# Patient Record
Sex: Female | Born: 1965 | Race: White | Hispanic: No | Marital: Married | Smoking: Never smoker
Health system: Southern US, Community
[De-identification: ages and names within clinical notes are randomized; demographics above are authoritative.]

---

## 1999-10-12 ENCOUNTER — Inpatient Hospital Stay (HOSPITAL_COMMUNITY): Admission: AD | Admit: 1999-10-12 | Discharge: 1999-10-12 | Payer: Self-pay | Admitting: Obstetrics

## 2004-01-21 ENCOUNTER — Other Ambulatory Visit: Admission: RE | Admit: 2004-01-21 | Discharge: 2004-01-21 | Payer: Self-pay | Admitting: Obstetrics and Gynecology

## 2005-03-21 ENCOUNTER — Ambulatory Visit (HOSPITAL_COMMUNITY): Admission: AD | Admit: 2005-03-21 | Discharge: 2005-03-21 | Payer: Self-pay | Admitting: Obstetrics and Gynecology

## 2005-03-27 ENCOUNTER — Ambulatory Visit (HOSPITAL_COMMUNITY): Admission: RE | Admit: 2005-03-27 | Discharge: 2005-03-27 | Payer: Self-pay | Admitting: Obstetrics and Gynecology

## 2005-03-27 ENCOUNTER — Encounter (INDEPENDENT_AMBULATORY_CARE_PROVIDER_SITE_OTHER): Payer: Self-pay | Admitting: Specialist

## 2006-08-15 ENCOUNTER — Encounter: Admission: RE | Admit: 2006-08-15 | Discharge: 2006-08-15 | Payer: Self-pay | Admitting: Obstetrics and Gynecology

## 2007-08-26 ENCOUNTER — Encounter: Admission: RE | Admit: 2007-08-26 | Discharge: 2007-08-26 | Payer: Self-pay | Admitting: Obstetrics and Gynecology

## 2008-08-26 ENCOUNTER — Encounter: Admission: RE | Admit: 2008-08-26 | Discharge: 2008-08-26 | Payer: Self-pay | Admitting: Obstetrics and Gynecology

## 2008-08-26 IMAGING — MG MM SCREEN MAMMOGRAM BILATERAL
4 series · 4 of 4 positions shown · non-contrast
Comparison: Prior studies.

DG SCREEN MAMMOGRAM BILATERAL
Bilateral CC and MLO view(s) were taken.
Prior study comparison: [DATE], DG screen mammogram bilateral.

DIGITAL SCREENING MAMMOGRAM WITH CAD:

[R CC]
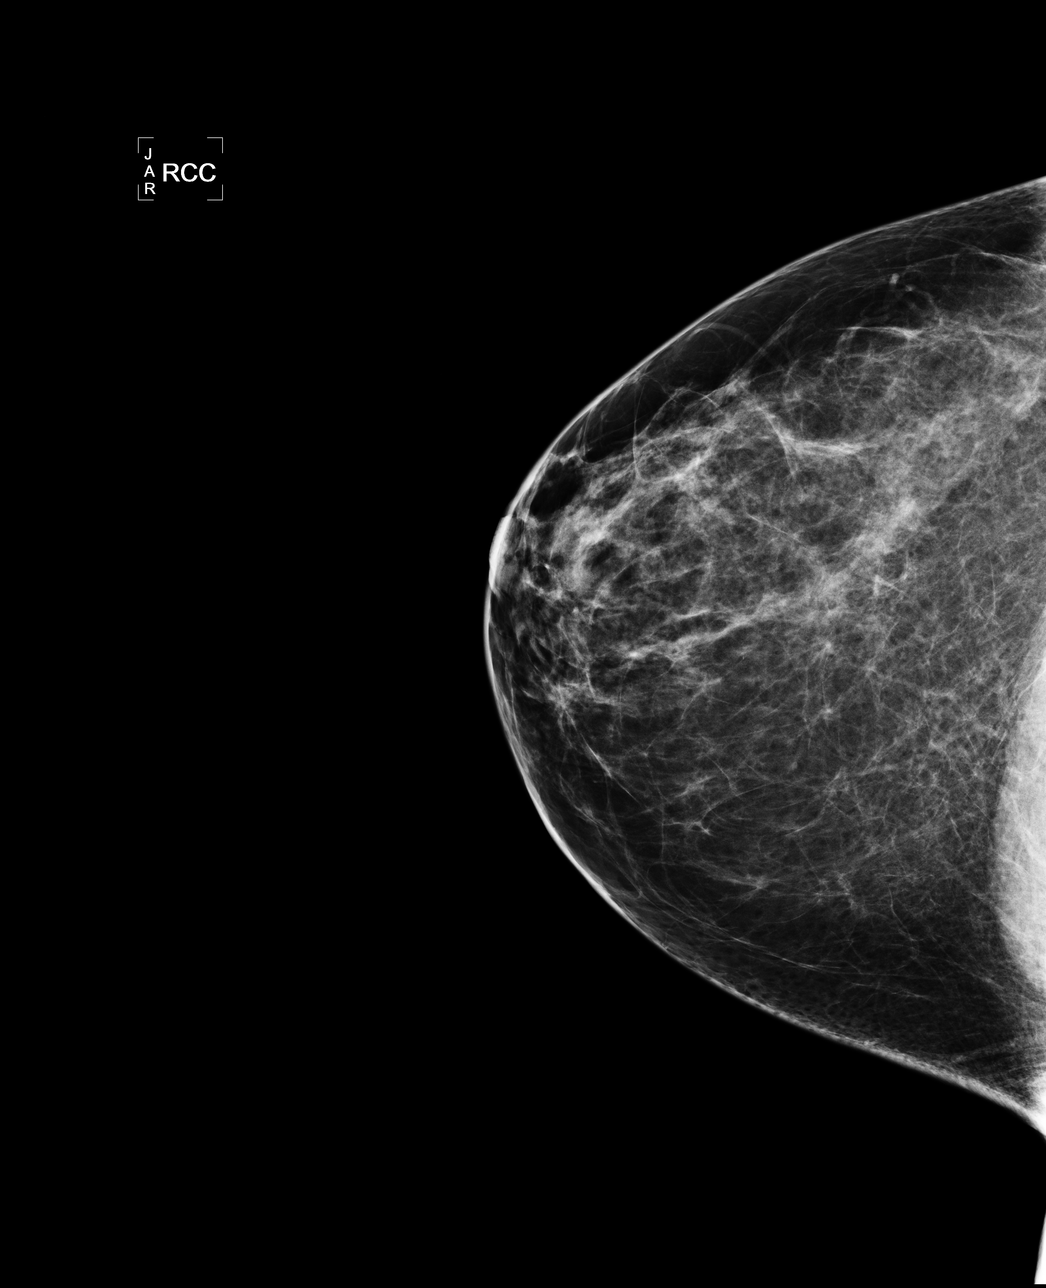

[L CC]
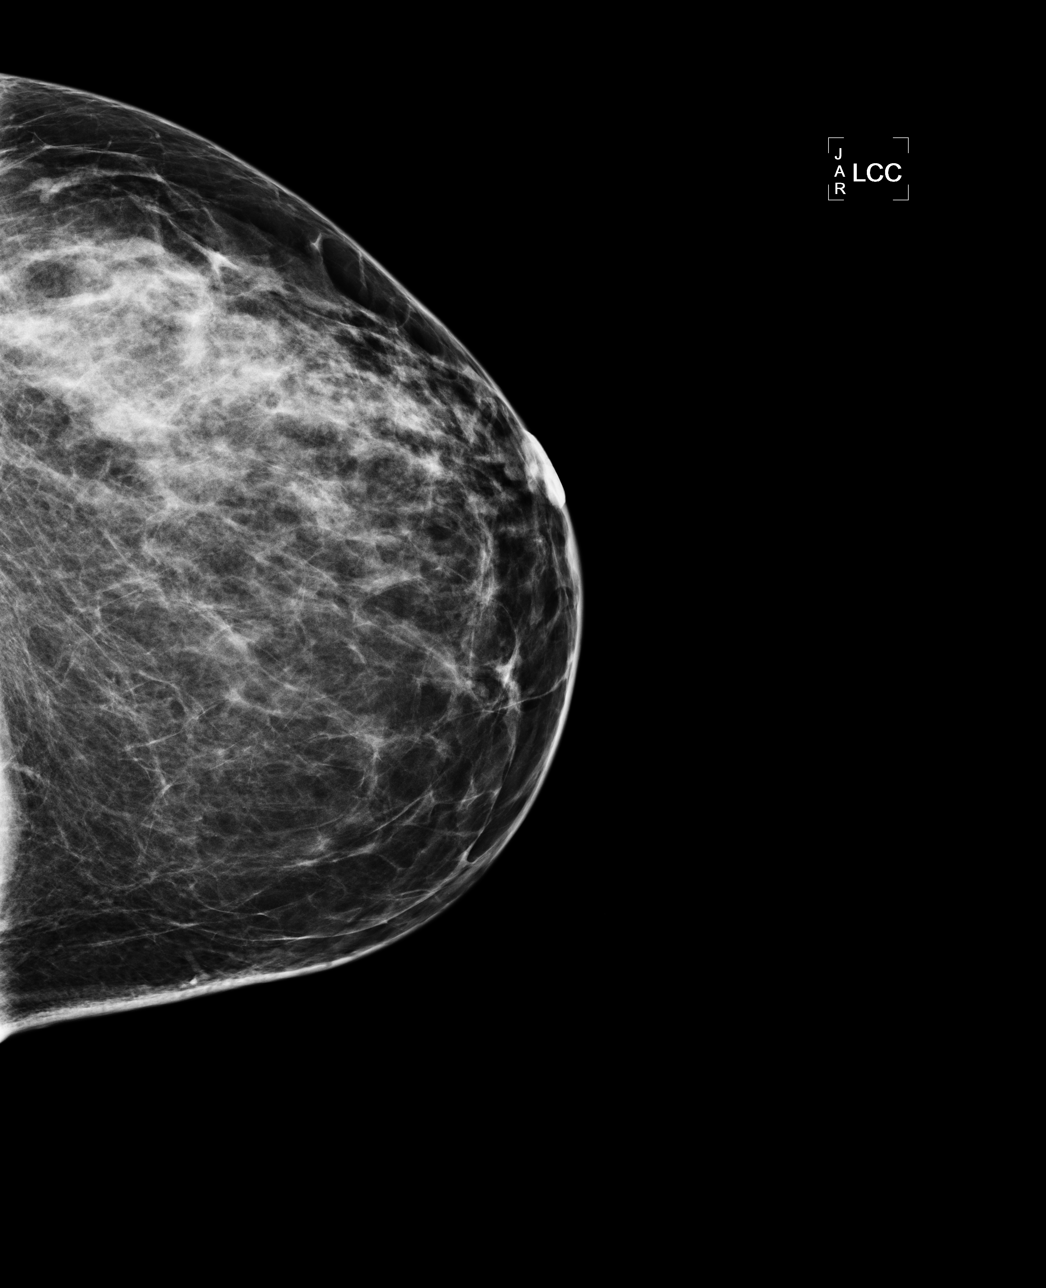

[L MLO]
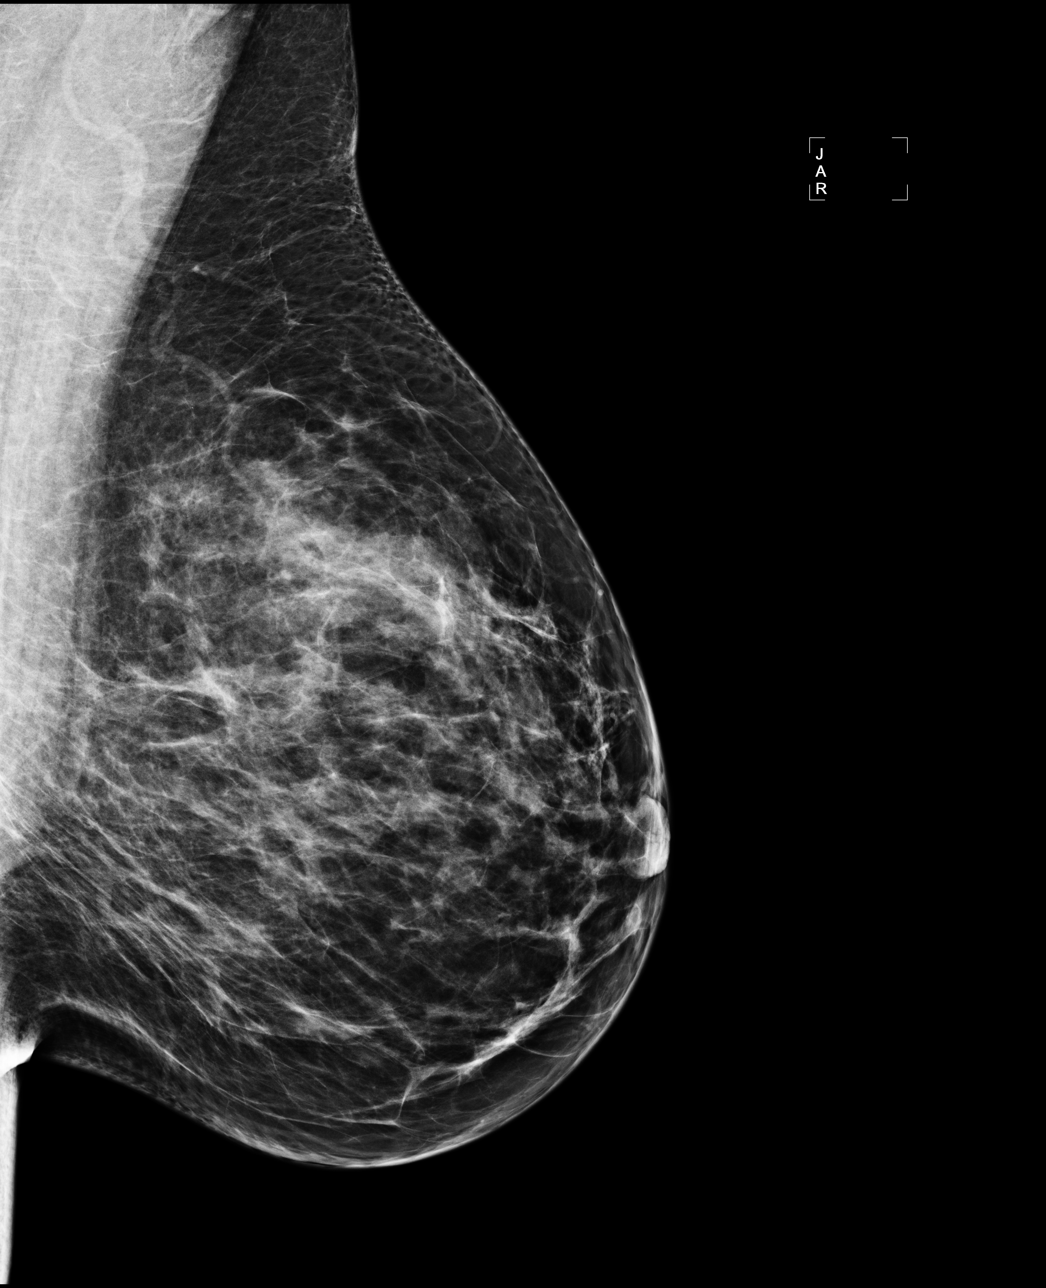

[R MLO]
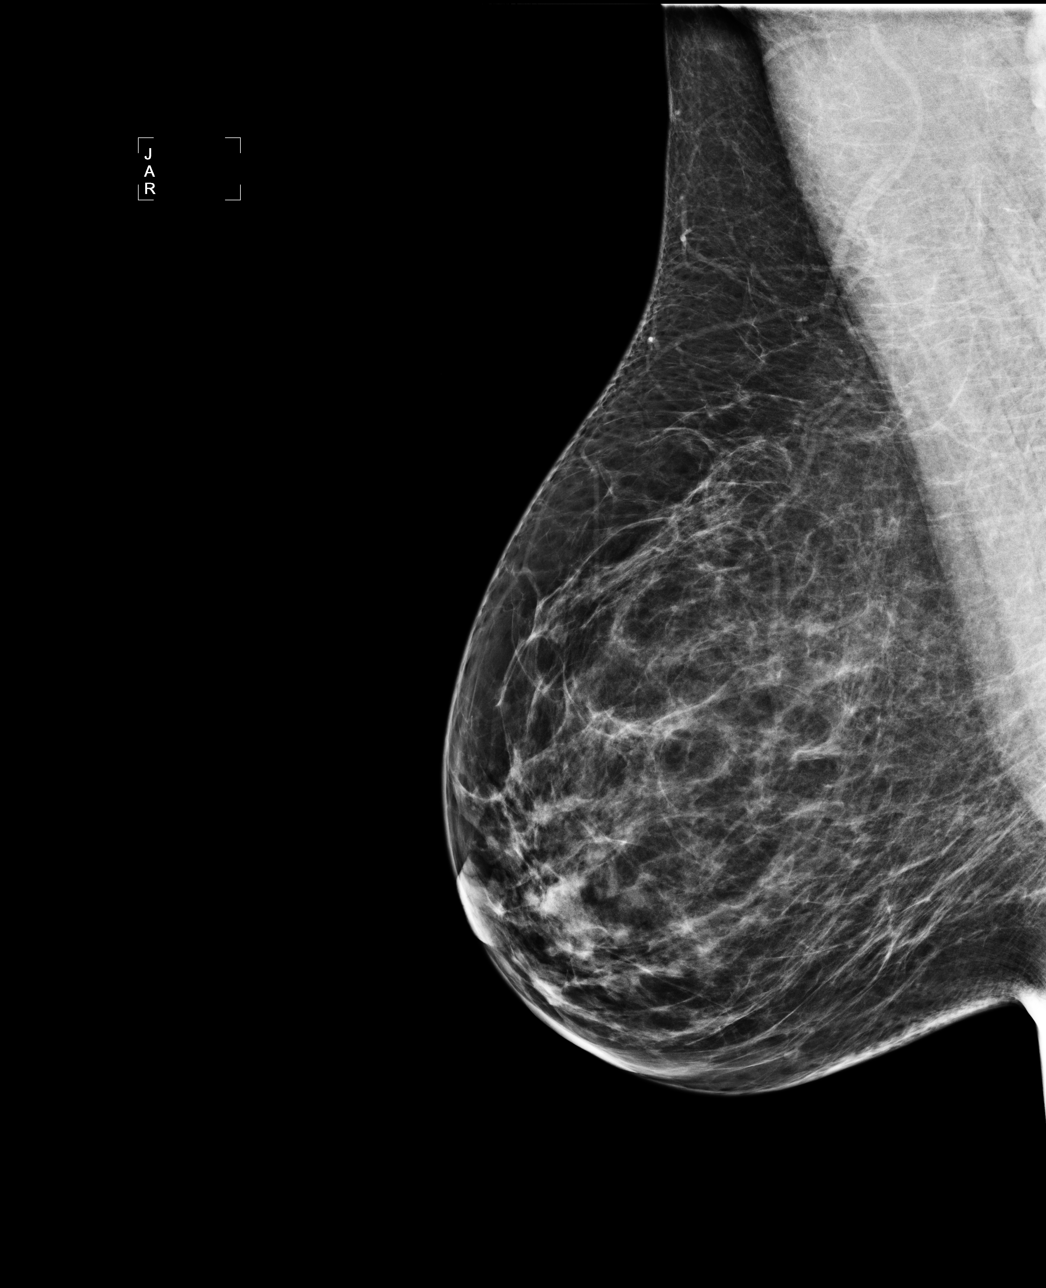

[4 of 4 positions shown; findings below may reference images not displayed]

There are scattered fibroglandular densities.  There is no dominant mass, architectural distortion 
or calcification to suggest malignancy.

Images were processed with CAD.
IMPRESSION: No mammographic evidence of malignancy.  Suggest yearly screening mammography.

A result letter of this screening mammogram will be mailed directly to the patient.

ASSESSMENT: Negative - BI-RADS 1

Screening mammogram in 1 year.
,

## 2009-08-27 ENCOUNTER — Encounter: Admission: RE | Admit: 2009-08-27 | Discharge: 2009-08-27 | Payer: Self-pay | Admitting: Obstetrics and Gynecology

## 2010-01-30 ENCOUNTER — Encounter: Payer: Self-pay | Admitting: Obstetrics and Gynecology

## 2010-05-27 NOTE — H&P (Signed)
NAMEJUDYTH, Sharon Walter           ACCOUNT NO.:  [id number]   MEDICAL RECORD NO.:  [id number]          PATIENT TYPE:  AMB   LOCATION:  [facility]                           FACILITY:  [facility]   PHYSICIAN:  Huel Cote, M.D. DATE OF BIRTH:  [birth year]   DATE OF ADMISSION:  03/27/2004  DATE OF DISCHARGE:                                HISTORY & PHYSICAL   HISTORY:  The patient is a 45 year old G1, P0, who comes in for a scheduled  suction D&C for a missed AB which is approximately 7.5 weeks by crown-rump  length with no fetal heart motion noted on ultrasound. After being counseled  as to options of expectant management versus D&C, the patient elects a D&C.   PAST MEDICAL HISTORY:  Significant for cholelithiasis.   PAST SURGICAL HISTORY:  None.   ALLERGIES:  None.   PAST GYN HISTORY:  No abnormal Pap smears, does have a history of HSV.   PHYSICAL EXAMINATION:  The patient is 5 feet, 8 inches. Weight is 230  pounds.  CARDIAC EXAM:  Regular rate and rhythm.  LUNGS:  Clear.  ABDOMEN:  Soft, nontender.  PELVIC EXAM:  She has normal external genitalia. Cervix normal. Uterus is  approximately 7-8 weeks in size and the adnexa are slightly large but still  within the normal range.   The patient became unexpectedly pregnant at 34-years of age although  pleasantly surprised. She had some history of secondary infertility which  may or may not be related to possible endometriosis. On ultrasound the  patient is noted to have some bilateral ovarian cysts which are slightly  complex nature which will be followed up with ultrasound after surgery. The  patient was carefully counseled of the risks and benefits of the procedure  including bleeding, infection, possible uterine perforation. The patient  understands these risks and desires to proceed with surgery as stated. She  declines genetic testing on the products of conception. However knows these  will be sent to pathology. The patient will also use  Cytotec 400 mcg in the  vagina 4 hours prior to procedure to assist with cervical dilation.      Huel Cote, M.D.  Electronically Signed     KR/MEDQ  D:  03/23/2005  T:  03/23/2005  Job:  [id number]

## 2010-05-27 NOTE — Op Note (Signed)
NAMEIMO, CUMBIE           ACCOUNT NO.:  [id number]   MEDICAL RECORD NO.:  [id number]          PATIENT TYPE:  AMB   LOCATION:  [facility]                           FACILITY:  [facility]   PHYSICIAN:  Huel Cote, M.D. DATE OF BIRTH:  Jul 06, 1965   DATE OF PROCEDURE:  03/27/2005  DATE OF DISCHARGE:                                 OPERATIVE REPORT   PREOPERATIVE DIAGNOSIS:  Missed abortion at 7+ weeks.   POSTOPERATIVE DIAGNOSIS:  Missed abortion at 7+ weeks.   PROCEDURE:  Suction dilatation and evacuation.   SURGEON:  Huel Cote, M.D.   ASSISTANT:  None.   ANESTHESIA:  MAC with a local 1% lidocaine block.   SPECIMENS:  Products of conception were sent.   ESTIMATED BLOOD LOSS:  100 mL.   COMPLICATIONS:  None.   The uterus was approximately 7 to 8 weeks in size with moderate amount of  products of conception noted consistent with this gestational age.   PROCEDURE:  The patient was taken to the operating room, where MAC  anesthesia was obtained without difficulty.  She was then prepped and draped  in the normal sterile fashion in the dorsal lithotomy position.  A speculum  was then placed in the patient's vagina and an in-and-out catheter utilized  to empty the bladder.  The lidocaine was then used approximately 20 mL with  10 mL injected at 2 o'clock and 10 mL injected at 10 o'clock for a local  block.  The uterus was then sounded without difficulty and sounded to  approximately 8 cm.  A 7 mm suction curette was then easily introduced after  the cervix was serially dilated with the [facility] dilators.  This was  accomplished easily as the patient had used Cytotec earlier in the morning  and the os was already partially dilated.  The suction curette was then  introduced into the uterine fundus and suction applied with a moderate  amount of products of conception obtained without difficulty.  Two  additional passes obtained only minimal tissue; therefore, suction was  discontinued and a sharp curettage performed with again no substantial  tissue note.  One additional pass with the suction curettage retrieved only  minimal amounts of decidualized-appearing tissue.  Therefore, the procedure  was concluded.  The tenaculum was removed from the patient's cervix and one  small area of bleeding  treated with a ring forceps.  There was no active bleeding noted after this  point aside from a very small amount from the cervical os.  At this point  all instruments and sponges were removed from the patient's vagina and the  patient was awakened from her MAC anesthesia and taken to the recovery room  without difficulty.      Huel Cote, M.D.  Electronically Signed     KR/MEDQ  D:  03/27/2005  T:  03/28/2005  Job:  [id number]

## 2010-07-28 ENCOUNTER — Other Ambulatory Visit: Payer: Self-pay | Admitting: Obstetrics and Gynecology

## 2010-07-28 DIAGNOSIS — Z1231 Encounter for screening mammogram for malignant neoplasm of breast: Secondary | ICD-10-CM

## 2010-08-29 ENCOUNTER — Ambulatory Visit
Admission: RE | Admit: 2010-08-29 | Discharge: 2010-08-29 | Disposition: A | Discharge status: Home / Self Care | Payer: BC Managed Care – PPO | Source: Ambulatory Visit | Attending: Obstetrics and Gynecology | Admitting: Obstetrics and Gynecology

## 2010-08-29 DIAGNOSIS — Z1231 Encounter for screening mammogram for malignant neoplasm of breast: Secondary | ICD-10-CM

## 2011-08-01 ENCOUNTER — Other Ambulatory Visit: Payer: Self-pay | Admitting: Obstetrics and Gynecology

## 2011-08-01 DIAGNOSIS — Z1231 Encounter for screening mammogram for malignant neoplasm of breast: Secondary | ICD-10-CM

## 2011-08-30 ENCOUNTER — Ambulatory Visit
Admission: RE | Admit: 2011-08-30 | Discharge: 2011-08-30 | Disposition: A | Discharge status: Home / Self Care | Payer: BC Managed Care – PPO | Source: Ambulatory Visit | Attending: Obstetrics and Gynecology | Admitting: Obstetrics and Gynecology

## 2011-08-30 DIAGNOSIS — Z1231 Encounter for screening mammogram for malignant neoplasm of breast: Secondary | ICD-10-CM

## 2015-07-11 ENCOUNTER — Emergency Department (HOSPITAL_BASED_OUTPATIENT_CLINIC_OR_DEPARTMENT_OTHER)
Admission: EM | Admit: 2015-07-11 | Discharge: 2015-07-11 | Disposition: A | Discharge status: Home / Self Care | Payer: BC Managed Care – PPO | Attending: Emergency Medicine | Admitting: Emergency Medicine

## 2015-07-11 ENCOUNTER — Encounter (HOSPITAL_BASED_OUTPATIENT_CLINIC_OR_DEPARTMENT_OTHER): Payer: Self-pay | Admitting: Emergency Medicine

## 2015-07-11 DIAGNOSIS — M7989 Other specified soft tissue disorders: Secondary | ICD-10-CM | POA: Diagnosis not present

## 2015-07-11 DIAGNOSIS — M79641 Pain in right hand: Secondary | ICD-10-CM | POA: Diagnosis present

## 2015-07-11 NOTE — ED Notes (Signed)
Cut ring off with the gem ring cutter per pt's request due to finger swelling.

## 2015-07-11 NOTE — ED Provider Notes (Addendum)
CSN: [id number]     Arrival date & time 07/11/15  0941 History   First MD Initiated Contact with Patient 07/11/15 [phone]     Chief Complaint  Patient presents with  . Hand Pain     (Consider location/radiation/quality/duration/timing/severity/associated sxs/prior Treatment) The history is provided by the patient.  Sharon Walter is a 50 y.o. female here presenting with right hand swelling. Patient states that she has been outside in the heat yesterday and a party. She has been drinking alcohol and eating some hot dogs. Woke up this morning and noticed that her right hand has been diffusely swollen. Denies trauma or injury to the hand. Denies history of arthritis. Went to urgent care, who is unable to remove the ring manually and sent here for evaluation   History reviewed. No pertinent past medical history. History reviewed. No pertinent past surgical history. No family history on file. Social History  Substance Use Topics  . Smoking status: Never Smoker   . Smokeless tobacco: None  . Alcohol Use: Yes     Comment: occassional   OB History    No data available     Review of Systems  Musculoskeletal:       Hand swelling   All other systems reviewed and are negative.     Allergies  Review of patient's allergies indicates no known allergies.  Home Medications   Prior to Admission medications   Not on File   BP 145/97 mmHg  Pulse 72  Temp(Src) 97.6 F (36.4 C) (Oral)  Resp 18  Ht 5\' 8"  (1.727 m)  Wt 240 lb (108.863 kg)  BMI 36.50 kg/m2  SpO2 100%  LMP 05/17/2015 (Exact Date) Physical Exam  Constitutional: She appears well-developed and well-nourished.  HENT:  Head: Normocephalic.  Eyes: Pupils are equal, round, and reactive to light.  Neck: Normal range of motion.  Cardiovascular: Normal rate.   Pulmonary/Chest: Effort normal.  Abdominal: Soft.  Musculoskeletal:  R hand diffusely swollen, no bony tenderness. Good radial pulses, nl capillary refill. Ring  removed by staff, able to bend all digits, neurovascular intact   Neurological: She is alert.  Skin: Skin is warm.  Psychiatric: She has a normal mood and affect. Her behavior is normal. Judgment and thought content normal.  Vitals reviewed.   ED Course  Procedures (including critical care time) Labs Review Labs Reviewed - No data to display  Imaging Review No results found. I have personally reviewed and evaluated these images and lab results as part of my medical decision-making.   EKG Interpretation None      MDM   Final diagnoses:  None    Sharon FrameMichelle Misch is a 50 y.o. female here with hand swelling, no trauma. Likely related to being in the heat yesterday and eating hot dogs. No swelling in the leg or other extremities. No signs of DVT/PE. Ring was removed with an electric ring cutter by staff. Vitals stable, well appearing. No hx of kidney issues. Recommend ice, elevation, low salt diet     Richardean Canalavid H Yao, MD 07/11/15 1008  Richardean Canalavid H Yao, MD 07/11/15 [phone]

## 2015-07-11 NOTE — Discharge Instructions (Signed)
Keep right arm elevated.   Eat low salt diet.   Apply ice to swelling.   See your doctor  Return to ER if you have worse hand swelling and severe pain, redness to the hand, unable to feel your hand

## 2015-07-11 NOTE — ED Notes (Signed)
Pt states right 4th finger began swelling last night due to unknown cause and today could not remove ring.  Pt denies any known injury to finger.

## 2020-01-29 ENCOUNTER — Ambulatory Visit (INDEPENDENT_AMBULATORY_CARE_PROVIDER_SITE_OTHER): Payer: BC Managed Care – PPO | Admitting: Allergy & Immunology

## 2020-01-29 ENCOUNTER — Encounter: Payer: Self-pay | Admitting: Allergy & Immunology

## 2020-01-29 ENCOUNTER — Other Ambulatory Visit: Payer: Self-pay

## 2020-01-29 VITALS — BP 130/90 | HR 78 | Temp 97.7°F | Resp 18 | Ht 68.0 in | Wt 246.2 lb

## 2020-01-29 DIAGNOSIS — R1310 Dysphagia, unspecified: Secondary | ICD-10-CM | POA: Diagnosis not present

## 2020-01-29 DIAGNOSIS — J310 Chronic rhinitis: Secondary | ICD-10-CM

## 2020-01-29 NOTE — Patient Instructions (Addendum)
1. Chronic rhinitis - Testing today showed: negative to the entire panel. - Copy of test results provided.  - This may be what is known as "local allergic rhinitis", which occurs when your immune system makes localized IgE (allergy antibodies) to various allergens (as opposed to systemic) IgE - Therefore, since IgE is not make systemically, allergic sensitizations are not picked up on routine skin testing. - There are specialized academic research centers that can test for IgE in the nasal cavity, but this is not done in the clinical setting at this time.  - It can still be treated with nasal sprays and antihistamines.  - Stop taking: Flonase and Singulair (montelukast) - Continue with: Zyrtec (cetirizine) - Start taking: Xhance (fluticasone) 1-2 sprays per nostril daily - You can use an extra dose of the antihistamine, if needed, for breakthrough symptoms.  - Consider nasal saline rinses 1-2 times daily to remove allergens from the nasal cavities as well as help with mucous clearance (this is especially helpful to do before the nasal sprays are given)  2. Dysphagia - We are going to refer you to see Gastroenterology.  3. Return in about 6 months (around 07/28/2020).   Please inform us of any Emergency Department visits, hospitalizations, or changes in symptoms. Call us before going to the ED for breathing or allergy symptoms since we might be able to fit you in for a sick visit. Feel free to contact us anytime with any questions, problems, or concerns.  It was a pleasure to meet you today! Thank you for doing what you do!!   Websites that have reliable patient information: 1. American Academy of Asthma, Allergy, and Immunology: www.aaaai.org 2. Food Allergy Research and Education (FARE): foodallergy.org 3. Mothers of Asthmatics: http://www.asthmacommunitynetwork.org 4. American College of Allergy, Asthma, and Immunology: www.acaai.org   COVID-19 Vaccine Information can be found at:  PodExchange.nl For questions related to vaccine distribution or appointments, please email [email or web address] or call [phone].     "Like" [place] on Facebook and Instagram for our latest updates!       Make sure you are registered to vote! If you have moved or changed any of your contact information, you will need to get this updated before voting!  In some cases, you MAY be able to register to vote online: [email or web address]

## 2020-01-29 NOTE — Progress Notes (Signed)
NEW PATIENT  Date of Service/Encounter:  01/29/20  Referring provider: Deatra James, MD   Assessment:   Chronic rhinitis - with negative testing to the entire panel  Dysphagia - EoE?  Adverse food reaction - with negative testing to corn and the major food allergens   Plan/Recommendations:   1. Chronic rhinitis - Testing today showed: negative to the entire panel. - Copy of test results provided.  - This may be what is known as "local allergic rhinitis", which occurs when your immune system makes localized IgE (allergy antibodies) to various allergens (as opposed to systemic) IgE - Therefore, since IgE is not make systemically, allergic sensitizations are not picked up on routine skin testing. - There are specialized academic research centers that can test for IgE in the nasal cavity, but this is not done in the clinical setting at this time.  - It can still be treated with nasal sprays and antihistamines.  - Stop taking: Flonase and Singulair (montelukast) - Continue with: Zyrtec (cetirizine) - Start taking: Xhance (fluticasone) 1-2 sprays per nostril daily - You can use an extra dose of the antihistamine, if needed, for breakthrough symptoms.  - Consider nasal saline rinses 1-2 times daily to remove allergens from the nasal cavities as well as help with mucous clearance (this is especially helpful to do before the nasal sprays are given)  2. Dysphagia - We are going to refer you to see Gastroenterology.  3. Return in about 6 months (around 07/28/2020).   Subjective:   Sharon Walter is a 55 y.o. female presenting today for evaluation of  Chief Complaint  Patient presents with  . Allergic Rhinitis     Dealt with allergies all her life. Has congestion. November at dinner had severe drainage. Saw PCP and said it was allergies. Cornbread caused choking where H[place]lek was done. Patient thinks she needed to see ENT but PCP says allergist is needed    Sharon Walter has a  history of the following: Patient Active Problem List   Diagnosis Date Noted  . Chronic rhinitis 01/30/2020  . Dysphagia 01/30/2020    History obtained from: chart review and patient.  Sharon Walter was referred by Deatra James, MD.     Sharon Walter is a 55 y.o. female presenting for an evaluation of environmental allergies.  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 She is going to retire this next year. She wants to do something not stressful.  Allergic Rhinitis Symptom History: She reports that she has had problems with allergies for years. She uses an antihistamine and a nasal steroid year round. They are getting worse over time. She feels that she has chronic congestion. She rarely takes a decongestant because of high blood pressure. She will sometimes do this, however. She has headaches over her eyes and drainage in the back of her throat.   Food Allergy Symptom History: In November 2021, she was having a lot of drainage and constantly clearing her throat. She had gone out to dinner with her husband and took a small bite of cornbread. When she went to swallow the cornbread, she could not breathe. She has not touched cornbread since that time. She was very worried and she felt that she could not breathe. She felt that maybe she needed an ENT. This is the only food that has done this. There was a time 18 months ago, where something similar happened when she was choking on something; the sensation was different. She does report that  she has had to drink a lot of water to get foods down her throat. She was actually having issues and anxiety with eating at school so she has been drinking soft foods and water and other liquids.   Otherwise, there is no history of other atopic diseases, including asthma, drug allergies, stinging insect allergies, eczema, urticaria or contact dermatitis. There is no significant infectious history. Vaccinations are up to date.    Past Medical  History: Patient Active Problem List   Diagnosis Date Noted  . Chronic rhinitis 01/30/2020  . Dysphagia 01/30/2020    Medication List:  Allergies as of 01/29/2020      Reactions   Prednisone Nausea Only      Medication List       Accurate as of January 29, 2020 11:59 PM. If you have any questions, ask your nurse or doctor.        ALPRAZolam 0.25 MG tablet Commonly known as: XANAX Take 0.25 mg by mouth daily as needed.   cetirizine 10 MG tablet Commonly known as: ZYRTEC 1 tablet   fluticasone 50 MCG/ACT nasal spray Commonly known as: FLONASE Place into both nostrils daily.   lisinopril-hydrochlorothiazide 20-25 MG tablet Commonly known as: ZESTORETIC lisinopril 20 mg-hydrochlorothiazide 25 mg tablet   montelukast 10 MG tablet Commonly known as: SINGULAIR Take 1 tablet by mouth daily.   simvastatin 20 MG tablet Commonly known as: ZOCOR simvastatin 20 mg tablet  TAKE 1 TABLET BY MOUTH ONCE DAILY IN THE EVENING   valACYclovir 1000 MG tablet Commonly known as: VALTREX Take 1,000 mg by mouth daily.       Birth History: non-contributory  Developmental History: non-contributory  Past Surgical History: History reviewed. No pertinent surgical history.   Family History: History reviewed. No pertinent family history.   Social History: Sharon Walter lives at home with her family. She lives in a house that is 56 years old. There is carpeting throughout the home. She has gas heating and central cooling. There are no animals inside or outside of the home. There are no dust mite covers on the bedding. 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 She is slated to retire next year. She is not exposed to fumes, chemicals, or dust. She does use a HEPA filter in the home. She does not live near an interstate or industrial area. She is not a smoker.   Review of Systems  Constitutional: Negative.  Negative for  chills, fever, malaise/fatigue and weight loss.  HENT: Positive for congestion and sinus pain. Negative for ear discharge, ear pain and sore throat.   Eyes: Negative for pain, discharge and redness.  Respiratory: Negative for cough, sputum production, shortness of breath and wheezing.   Cardiovascular: Negative.  Negative for chest pain and palpitations.  Gastrointestinal: Negative for abdominal pain, constipation, diarrhea, heartburn, nausea and vomiting.  Skin: Negative.  Negative for itching and rash.  Neurological: Negative for dizziness and headaches.  Endo/Heme/Allergies: Positive for environmental allergies. Does not bruise/bleed easily.       Objective:   Blood pressure 130/90, pulse 78, temperature 97.7 F (36.5 C), resp. rate 18, height 5\' 8"  (1.727 m), weight 246 lb 3.2 oz (111.7 kg), SpO2 97 %. Body mass index is 37.43 kg/m.   Physical Exam:   Physical Exam Constitutional:      Appearance: She is well-developed.     Comments: Very interactive. Pleasant.  HENT:     Head: Normocephalic and  atraumatic.     Right Ear: Tympanic membrane, ear canal and external ear normal. No drainage, swelling or tenderness. Tympanic membrane is not injected, scarred, erythematous, retracted or bulging.     Left Ear: Tympanic membrane, ear canal and external ear normal. No drainage, swelling or tenderness. Tympanic membrane is not injected, scarred, erythematous, retracted or bulging.     Nose: No nasal deformity, septal deviation, mucosal edema, rhinorrhea or epistaxis.     Right Turbinates: Enlarged, swollen and pale.     Left Turbinates: Enlarged, swollen and pale.     Right Sinus: No maxillary sinus tenderness or frontal sinus tenderness.     Left Sinus: No maxillary sinus tenderness or frontal sinus tenderness.     Mouth/Throat:     Mouth: Oropharynx is clear and moist. Mucous membranes are not pale and not dry.     Pharynx: Uvula midline.     Comments: Cobblestoning  present. Eyes:     General:        Right eye: No discharge.        Left eye: No discharge.     Extraocular Movements: EOM normal.     Conjunctiva/sclera: Conjunctivae normal.     Right eye: Right conjunctiva is not injected. No chemosis.    Left eye: Left conjunctiva is not injected. No chemosis.    Pupils: Pupils are equal, round, and reactive to light.  Cardiovascular:     Rate and Rhythm: Normal rate and regular rhythm.     Heart sounds: Normal heart sounds.  Pulmonary:     Effort: Pulmonary effort is normal. No tachypnea, accessory muscle usage or respiratory distress.     Breath sounds: Normal breath sounds. No wheezing, rhonchi or rales.     Comments: Moving air well in all lung fields. No increased work of breathing. Chest:     Chest wall: No tenderness.  Abdominal:     Tenderness: There is no abdominal tenderness. There is no guarding or rebound.  Lymphadenopathy:     Head:     Right side of head: No submandibular, tonsillar or occipital adenopathy.     Left side of head: No submandibular, tonsillar or occipital adenopathy.     Cervical: No cervical adenopathy.  Skin:    Coloration: Skin is not pale.     Findings: No abrasion, erythema, petechiae or rash. Rash is not papular, urticarial or vesicular.  Neurological:     Mental Status: She is alert.  Psychiatric:        Mood and Affect: Mood and affect normal.      Diagnostic studies:   Allergy Studies:     Airborne Adult Perc - 01/29/20 1505    Time Antigen Placed 1505    Allergen Manufacturer Waynette Buttery    Location Back    Number of Test 59    2. Control-Histamine 1 mg/ml 2+    3. Albumin saline Negative    4. Bahia Negative    5. French Southern Territories Negative    6. Johnson Negative    7. Kentucky Blue Negative    8. Meadow Fescue Negative    9. Perennial Rye Negative    10. Sweet Vernal Negative    11. Timothy Negative    12. Cocklebur Negative    13. Burweed Marshelder Negative    14. Ragweed, short Negative    15.  Ragweed, Giant Negative    16. Plantain,  English Negative    17. Lamb's Quarters Negative    18. Sheep Sorrell  Negative    19. Rough Pigweed Negative    20. Marsh Elder, Rough Negative    21. Mugwort, Common Negative    22. Ash mix Negative    23. Birch mix Negative    24. Beech American Negative    25. Box, Elder Negative    26. Cedar, red Negative    27. Cottonwood, Guinea-BissauEastern Negative    28. Elm mix Negative    29. Hickory Negative    30. Maple mix Negative    31. Oak, Guinea-Bissau[place]astern mix Negative    32. Pecan Pollen Negative    33. Pine mix Negative    34. Sycamore Eastern Negative    35. Walnut, Black Pollen Negative    36. Alternaria alternata Negative    37. Cladosporium Herbarum Negative    38. Aspergillus mix Negative    39. Penicillium mix Negative    40. Bipolaris sorokiniana (Helminthosporium) Negative    41. Drechslera spicifera (Curvularia) Negative    42. Mucor plumbeus Negative    43. Fusarium moniliforme Negative    44. Aureobasidium pullulans (pullulara) Negative    45. Rhizopus oryzae Negative    46. Botrytis cinera Negative    47. Epicoccum nigrum Negative    48. Phoma betae Negative    49. Candida Albicans Negative    50. Trichophyton mentagrophytes Negative    51. Mite, D Farinae  5,000 AU/ml Negative    52. Mite, D Pteronyssinus  5,000 AU/ml Negative    53. Cat Hair 10,000 BAU/ml Negative    54.  Dog Epithelia Negative    55. Mixed Feathers Negative    56. Horse Epithelia Negative    57. Cockroach, German Negative    58. Mouse Negative    59. Tobacco Leaf Negative          Food Perc - 01/29/20 1505      Test Information   Time Antigen Placed 1505    Allergen Manufacturer Waynette ButteryGreer    Location Back    Number of allergen test 10      Food   1. Peanut Negative    2. Soybean food Negative    3. Wheat, whole Negative    4. Sesame Negative    5. Milk, cow Negative    6. Egg White, chicken Negative    7. Casein Negative    8. Shellfish mix Negative     9. Fish mix Negative    10. Cashew Negative          Intradermal - 01/29/20 1639    Time Antigen Placed 1639    Allergen Manufacturer Waynette ButteryGreer    Location Arm    Number of Test 15    Control Negative    French Southern TerritoriesBermuda Negative    Johnson Negative    7 Grass Negative    Ragweed mix Negative    Weed mix Negative    Tree mix Negative    Mold 1 Negative    Mold 2 Negative    Mold 3 Negative    Mold 4 Negative    Cat Negative    Dog Negative    Cockroach Negative    Mite mix Negative          Food Adult Perc - 01/29/20 1500    Time Antigen Placed 1502    Allergen Manufacturer Waynette ButteryGreer    Location Back    Number of allergen test 2    Control-Histamine 1 mg/ml 2+    53. Corn Negative  Allergy testing results were read and interpreted by myself, documented by clinical staff.         Malachi Bonds, MD [facility]

## 2020-01-30 ENCOUNTER — Encounter: Payer: Self-pay | Admitting: Allergy & Immunology

## 2020-01-30 DIAGNOSIS — J310 Chronic rhinitis: Secondary | ICD-10-CM | POA: Insufficient documentation

## 2020-01-30 DIAGNOSIS — R1310 Dysphagia, unspecified: Secondary | ICD-10-CM | POA: Insufficient documentation

## 2020-02-02 ENCOUNTER — Telehealth: Payer: Self-pay | Admitting: Allergy & Immunology

## 2020-02-02 NOTE — Telephone Encounter (Signed)
-----   Message from Alfonse Spruce, MD sent at 01/30/2020  3:20 PM EST ----- GI referral placed.

## 2020-02-02 NOTE — Telephone Encounter (Signed)
GI referral placed to [facility] located at [address], Phone number: [phone]. [facility] will reach out to patient to schedule an appointment. Left detailed message on patient voicemail with this information and to call back with any questions.
